# Patient Record
Sex: Female | Born: 1960 | Race: White | Hispanic: No | Smoking: Never smoker
Health system: Southern US, Community
[De-identification: ages and names within clinical notes are randomized; demographics above are authoritative.]

## PROBLEM LIST (undated history)

## (undated) DIAGNOSIS — G4730 Sleep apnea, unspecified: Secondary | ICD-10-CM

## (undated) DIAGNOSIS — D649 Anemia, unspecified: Secondary | ICD-10-CM

## (undated) DIAGNOSIS — N289 Disorder of kidney and ureter, unspecified: Secondary | ICD-10-CM

## (undated) DIAGNOSIS — E079 Disorder of thyroid, unspecified: Secondary | ICD-10-CM

## (undated) DIAGNOSIS — F4310 Post-traumatic stress disorder, unspecified: Secondary | ICD-10-CM

## (undated) DIAGNOSIS — I10 Essential (primary) hypertension: Secondary | ICD-10-CM

## (undated) DIAGNOSIS — M797 Fibromyalgia: Secondary | ICD-10-CM

## (undated) DIAGNOSIS — E282 Polycystic ovarian syndrome: Secondary | ICD-10-CM

## (undated) HISTORY — PX: PARATHYROIDECTOMY: SHX19

## (undated) HISTORY — PX: KIDNEY SURGERY: SHX687

---

## 1994-06-27 HISTORY — PX: ABDOMINAL EXPLORATION SURGERY: SHX538

## 2000-06-27 HISTORY — PX: GASTRIC BYPASS: SHX52

## 2007-08-10 ENCOUNTER — Emergency Department (HOSPITAL_COMMUNITY): Admission: EM | Admit: 2007-08-10 | Discharge: 2007-08-10 | Payer: Self-pay | Admitting: Emergency Medicine

## 2020-05-11 ENCOUNTER — Emergency Department (HOSPITAL_BASED_OUTPATIENT_CLINIC_OR_DEPARTMENT_OTHER): Payer: Medicare HMO

## 2020-05-11 ENCOUNTER — Encounter (HOSPITAL_BASED_OUTPATIENT_CLINIC_OR_DEPARTMENT_OTHER): Payer: Self-pay

## 2020-05-11 ENCOUNTER — Other Ambulatory Visit: Payer: Self-pay

## 2020-05-11 ENCOUNTER — Emergency Department (HOSPITAL_BASED_OUTPATIENT_CLINIC_OR_DEPARTMENT_OTHER)
Admission: EM | Admit: 2020-05-11 | Discharge: 2020-05-11 | Disposition: A | Discharge status: Home / Self Care | Payer: Medicare HMO | Attending: Emergency Medicine | Admitting: Emergency Medicine

## 2020-05-11 DIAGNOSIS — Z79899 Other long term (current) drug therapy: Secondary | ICD-10-CM | POA: Insufficient documentation

## 2020-05-11 DIAGNOSIS — R0602 Shortness of breath: Secondary | ICD-10-CM | POA: Diagnosis not present

## 2020-05-11 DIAGNOSIS — I129 Hypertensive chronic kidney disease with stage 1 through stage 4 chronic kidney disease, or unspecified chronic kidney disease: Secondary | ICD-10-CM | POA: Diagnosis not present

## 2020-05-11 DIAGNOSIS — N189 Chronic kidney disease, unspecified: Secondary | ICD-10-CM | POA: Insufficient documentation

## 2020-05-11 DIAGNOSIS — Z20822 Contact with and (suspected) exposure to covid-19: Secondary | ICD-10-CM | POA: Insufficient documentation

## 2020-05-11 DIAGNOSIS — Z91040 Latex allergy status: Secondary | ICD-10-CM | POA: Diagnosis not present

## 2020-05-11 DIAGNOSIS — R079 Chest pain, unspecified: Secondary | ICD-10-CM | POA: Insufficient documentation

## 2020-05-11 HISTORY — DX: Disorder of thyroid, unspecified: E07.9

## 2020-05-11 HISTORY — DX: Essential (primary) hypertension: I10

## 2020-05-11 HISTORY — DX: Post-traumatic stress disorder, unspecified: F43.10

## 2020-05-11 HISTORY — DX: Disorder of kidney and ureter, unspecified: N28.9

## 2020-05-11 HISTORY — DX: Fibromyalgia: M79.7

## 2020-05-11 HISTORY — DX: Sleep apnea, unspecified: G47.30

## 2020-05-11 HISTORY — DX: Anemia, unspecified: D64.9

## 2020-05-11 HISTORY — DX: Polycystic ovarian syndrome: E28.2

## 2020-05-11 LAB — BASIC METABOLIC PANEL
Anion gap: 11 (ref 5–15)
BUN: 11 mg/dL (ref 6–20)
CO2: 21 mmol/L — ABNORMAL LOW (ref 22–32)
Calcium: 7.4 mg/dL — ABNORMAL LOW (ref 8.9–10.3)
Chloride: 105 mmol/L (ref 98–111)
Creatinine, Ser: 1.22 mg/dL — ABNORMAL HIGH (ref 0.44–1.00)
GFR, Estimated: 52 mL/min — ABNORMAL LOW (ref >60)
Glucose, Bld: 95 mg/dL (ref 70–99)
Potassium: 3.4 mmol/L — ABNORMAL LOW (ref 3.5–5.1)
Sodium: 137 mmol/L (ref 135–145)

## 2020-05-11 LAB — CBC WITH DIFFERENTIAL/PLATELET
Abs Immature Granulocytes: 0.04 10*3/uL (ref 0.00–0.07)
Basophils Absolute: 0.1 10*3/uL (ref 0.0–0.1)
Basophils Relative: 1 %
Eosinophils Absolute: 0.2 10*3/uL (ref 0.0–0.5)
Eosinophils Relative: 3 %
HCT: 37.4 % (ref 36.0–46.0)
Hemoglobin: 11.4 g/dL — ABNORMAL LOW (ref 12.0–15.0)
Immature Granulocytes: 1 %
Lymphocytes Relative: 16 %
Lymphs Abs: 1.1 10*3/uL (ref 0.7–4.0)
MCH: 26.6 pg (ref 26.0–34.0)
MCHC: 30.5 g/dL (ref 30.0–36.0)
MCV: 87.4 fL (ref 80.0–100.0)
Monocytes Absolute: 0.4 10*3/uL (ref 0.1–1.0)
Monocytes Relative: 6 %
Neutro Abs: 5.1 10*3/uL (ref 1.7–7.7)
Neutrophils Relative %: 73 %
Platelets: 216 10*3/uL (ref 150–400)
RBC: 4.28 MIL/uL (ref 3.87–5.11)
RDW: 15.1 % (ref 11.5–15.5)
WBC: 6.9 10*3/uL (ref 4.0–10.5)
nRBC: 0 % (ref 0.0–0.2)

## 2020-05-11 LAB — D-DIMER, QUANTITATIVE: D-Dimer, Quant: 0.54 ug/mL-FEU — ABNORMAL HIGH (ref 0.00–0.50)

## 2020-05-11 LAB — HEPATIC FUNCTION PANEL
ALT: 14 U/L (ref 0–44)
AST: 13 U/L — ABNORMAL LOW (ref 15–41)
Albumin: 4 g/dL (ref 3.5–5.0)
Alkaline Phosphatase: 65 U/L (ref 38–126)
Bilirubin, Direct: 0.1 mg/dL (ref 0.0–0.2)
Indirect Bilirubin: 0.5 mg/dL (ref 0.3–0.9)
Total Bilirubin: 0.6 mg/dL (ref 0.3–1.2)
Total Protein: 7 g/dL (ref 6.5–8.1)

## 2020-05-11 LAB — RESPIRATORY PANEL BY RT PCR (FLU A&B, COVID)
Influenza A by PCR: NEGATIVE
Influenza B by PCR: NEGATIVE
SARS Coronavirus 2 by RT PCR: NEGATIVE

## 2020-05-11 LAB — TROPONIN I (HIGH SENSITIVITY): Troponin I (High Sensitivity): 2 ng/L (ref <18)

## 2020-05-11 MED ORDER — ONDANSETRON HCL 4 MG PO TABS: 4.0000 mg | ORAL_TABLET | Freq: Three times a day (TID) | ORAL | 0 refills | Status: AC | PRN

## 2020-05-11 NOTE — ED Triage Notes (Signed)
Pt arrives with reports of parathyroid ectomy on Monday states that she has been SOB since. Pt was not Covid Tested prior to her surgery. Denies any history of asthma.

## 2020-05-11 NOTE — ED Notes (Signed)
Discharge instructions discussed with patient. Verbalized understanding of follow up care. Departs ED at this time in stable condition.

## 2020-05-11 NOTE — ED Provider Notes (Signed)
[facility] EMERGENCY DEPARTMENT Provider Note   CSN: [id number] Arrival date & time: 05/11/20  1736     History Chief Complaint  Patient presents with  . Shortness of Breath  . Chest Pain    Gina Dawson is a 59 y.o. female.  The history is provided by the patient.  Shortness of Breath Severity:  Moderate Onset quality:  Gradual Timing:  Intermittent Progression:  Waxing and waning Chronicity:  New Relieved by:  Nothing Worsened by:  Nothing Associated symptoms: chest pain   Associated symptoms: no abdominal pain, no claudication, no cough, no diaphoresis, no ear pain, no fever, no headaches, no hemoptysis, no neck pain, no PND, no rash, no sore throat, no sputum production, no syncope, no swollen glands, no vomiting and no wheezing   Risk factors: recent surgery (parathyroid removed last week, stayed overnight.)   Risk factors: no hx of PE/DVT        Past Medical History:  Diagnosis Date  . Anemia   . Fibromyalgia   . Hypertension   . PCOS (polycystic ovarian syndrome)   . PTSD (post-traumatic stress disorder)   . Renal disorder   . Sleep apnea   . Thyroid disease     There are no problems to display for this patient.     OB History   No obstetric history on file.     No family history on file.  Social History   Tobacco Use  . Smoking status: Never Smoker  . Smokeless tobacco: Never Used  Substance Use Topics  . Alcohol use: Never  . Drug use: Never    Home Medications Prior to Admission medications   Medication Sig Start Date End Date Taking? Authorizing Provider  amLODipine (NORVASC) 10 MG tablet Take by mouth. 10/12/19  Yes [provider]  baclofen (LIORESAL) 10 MG tablet Take by mouth. 03/20/19  Yes [provider]  buPROPion (WELLBUTRIN XL) 300 MG 24 hr tablet Take by mouth. 12/27/19  Yes [provider]  busPIRone (BUSPAR) 5 MG tablet Take by mouth. 11/21/19  Yes [provider]  calcium  carbonate (OS-CAL) 1250 (500 Ca) MG chewable tablet Chew by mouth. 05/05/20  Yes [provider]  DULoxetine (CYMBALTA) 60 MG capsule Take by mouth. 01/28/19  Yes [provider]  fluticasone (FLONASE) 50 MCG/ACT nasal spray 2 sprays by Each Nare route 2 (two) times daily as needed for Allergies. 09/25/18  Yes [provider]  furosemide (LASIX) 40 MG tablet Take by mouth. 10/02/18  Yes [provider]  potassium chloride SA (KLOR-CON) 20 MEQ tablet  03/20/19  Yes [provider]  pregabalin (LYRICA) 75 MG capsule Take by mouth. 08/27/18  Yes [provider]  topiramate (TOPAMAX) 50 MG tablet Take by mouth. 08/08/19  Yes [provider]  traZODone (DESYREL) 100 MG tablet Take by mouth. 07/27/19  Yes [provider]  oxybutynin (DITROPAN-XL) 10 MG 24 hr tablet Take by mouth.    [provider]    Allergies    Sulfamethoxazole-trimethoprim, Iodine, Latex, Sulfur, and Sulfa antibiotics  Review of Systems   Review of Systems  Constitutional: Negative for chills, diaphoresis and fever.  HENT: Negative for ear pain and sore throat.   Eyes: Negative for pain and visual disturbance.  Respiratory: Positive for shortness of breath. Negative for cough, hemoptysis, sputum production and wheezing.   Cardiovascular: Positive for chest pain. Negative for palpitations, claudication, syncope and PND.  Gastrointestinal: Negative for abdominal pain and vomiting.  Genitourinary: Negative for dysuria and hematuria.  Musculoskeletal: Negative for arthralgias, back pain and neck pain.  Skin: Negative for color change and rash.  Neurological: Negative for seizures, syncope and headaches.  Psychiatric/Behavioral: The patient is nervous/anxious.   All other systems reviewed and are negative.   Physical Exam Updated Vital Signs BP 137/66   Pulse 78   Temp 98.4 F (36.9 C) (Oral)   Resp (!) 22   Ht 5\' 4"  (1.626 m)   Wt 133.8 kg   SpO2  100%   BMI 50.64 kg/m   Physical Exam Vitals and nursing note reviewed.  Constitutional:      General: She is not in acute distress.    Appearance: She is well-developed. She is not ill-appearing.  HENT:     Head: Normocephalic and atraumatic.  Eyes:     Extraocular Movements: Extraocular movements intact.     Conjunctiva/sclera: Conjunctivae normal.     Pupils: Pupils are equal, round, and reactive to light.  Neck:     Comments: Overall surgical site in the thyroid appears clean dry and intact, no major swelling, no crepitus, no erythema, normal phonation, no trismus Cardiovascular:     Rate and Rhythm: Normal rate and regular rhythm.     Pulses: Normal pulses.     Heart sounds: Normal heart sounds. No murmur heard.   Pulmonary:     Effort: Pulmonary effort is normal. No respiratory distress.     Breath sounds: Normal breath sounds. No decreased breath sounds, wheezing, rhonchi or rales.  Abdominal:     Palpations: Abdomen is soft.     Tenderness: There is no abdominal tenderness.  Musculoskeletal:     Cervical back: Normal range of motion and neck supple.     Right lower leg: No edema.     Left lower leg: No edema.  Skin:    General: Skin is warm and dry.  Neurological:     General: No focal deficit present.     Mental Status: She is alert.  Psychiatric:        Mood and Affect: Mood is anxious.     ED Results / Procedures / Treatments   Labs (all labs ordered are listed, but only abnormal results are displayed) Labs Reviewed  CBC WITH DIFFERENTIAL/PLATELET - Abnormal; Notable for the following components:      Result Value   Hemoglobin 11.4 (*)    All other components within normal limits  BASIC METABOLIC PANEL - Abnormal; Notable for the following components:   Potassium 3.4 (*)    CO2 21 (*)    Creatinine, Ser 1.22 (*)    Calcium 7.4 (*)    GFR, Estimated 52 (*)    All other components within normal limits  HEPATIC FUNCTION PANEL - Abnormal; Notable for  the following components:   AST 13 (*)    All other components within normal limits  D-DIMER, QUANTITATIVE (NOT AT [facility]) - Abnormal; Notable for the following components:   D-Dimer, Quant 0.54 (*)    All other components within normal limits  RESPIRATORY PANEL BY RT PCR (FLU A&B, COVID)  TROPONIN I (HIGH SENSITIVITY)    EKG EKG Interpretation  Date/Time:  Monday May 11 2020 17:53:27 EST Ventricular Rate:  71 PR Interval:    QRS Duration: 106 QT Interval:  449 QTC Calculation: 488 R Axis:   -3 Text Interpretation: Sinus rhythm Low voltage, precordial leads s Borderline prolonged QT interval Confirmed by 12-20-2002 [phone]) on 05/11/2020 6:04:11 PM  Radiology DG Chest Portable 1 View  Result Date: 05/11/2020 CLINICAL DATA:  Chest pain and SOB. Status post parathyroidectomy on Monday with persistent shortness of breath since surgery. EXAM: PORTABLE CHEST 1 VIEW COMPARISON:  None. FINDINGS: The heart size and mediastinal contours are within normal limits. Both lungs are clear. The visualized skeletal structures are unremarkable. IMPRESSION: No active disease. Electronically Signed   By: Signa Kell M.D.   On: 05/11/2020 18:18    Procedures Procedures (including critical care time)  Medications Ordered in ED Medications - No data to display  ED Course  I have reviewed the triage vital signs and the nursing notes.  Pertinent labs & imaging results that were available during my care of the patient were reviewed by me and considered in my medical decision making (see chart for details).    MDM Rules/Calculators/A&P                          Micah Galeno is a 59 year old female with history of sleep apnea, CKD who presents the ED with chest pain, shortness of breath.  Symptoms on and off for the last week.  Had parathyroid removed last week.  Stayed overnight in the hospital.  Has no history of cardiac disease.  No history of PE.  No asthma symptoms.  No fever, no  cough.  Overall she appears comfortable in the room but upon questioning she did become tearful and anxious.  EKG shows sinus rhythm.  No ischemic changes.  Will evaluate for ACS with troponin.  Will get D-dimer but overall believe PE risk is low.  She has not had any significant bleeding post her surgery.  Will check for anemia and electrolyte abnormality.  Possibly this could be from some anxiety as well.  Will evaluate for Covid as well.  D-dimer is age-adjusted normal.  Troponin normal.  No significant anemia, electrolyte abnormality, kidney injury.  Covid test is negative.  Chest x-ray without any signs of infection.  Overall suspect some anxiety as cause of symptoms today.  We will have her follow-up with primary care doctor.  Discharged in the ED in good condition.  This chart was dictated using voice recognition software.  Despite best efforts to proofread,  errors can occur which can change the documentation meaning.   Final Clinical Impression(s) / ED Diagnoses Final diagnoses:  Shortness of breath    Rx / DC Orders ED Discharge Orders    None       Virgina Norfolk, DO 05/11/20 1915

## 2021-10-07 IMAGING — DX DG CHEST 1V PORT
1 series · 1 of 1 positions shown · non-contrast
Comparison: None.

CLINICAL DATA: Chest pain and SOB. Status post parathyroidectomy on
[REDACTED] with persistent shortness of breath since surgery.

EXAM:
PORTABLE CHEST 1 VIEW

[chest ap]
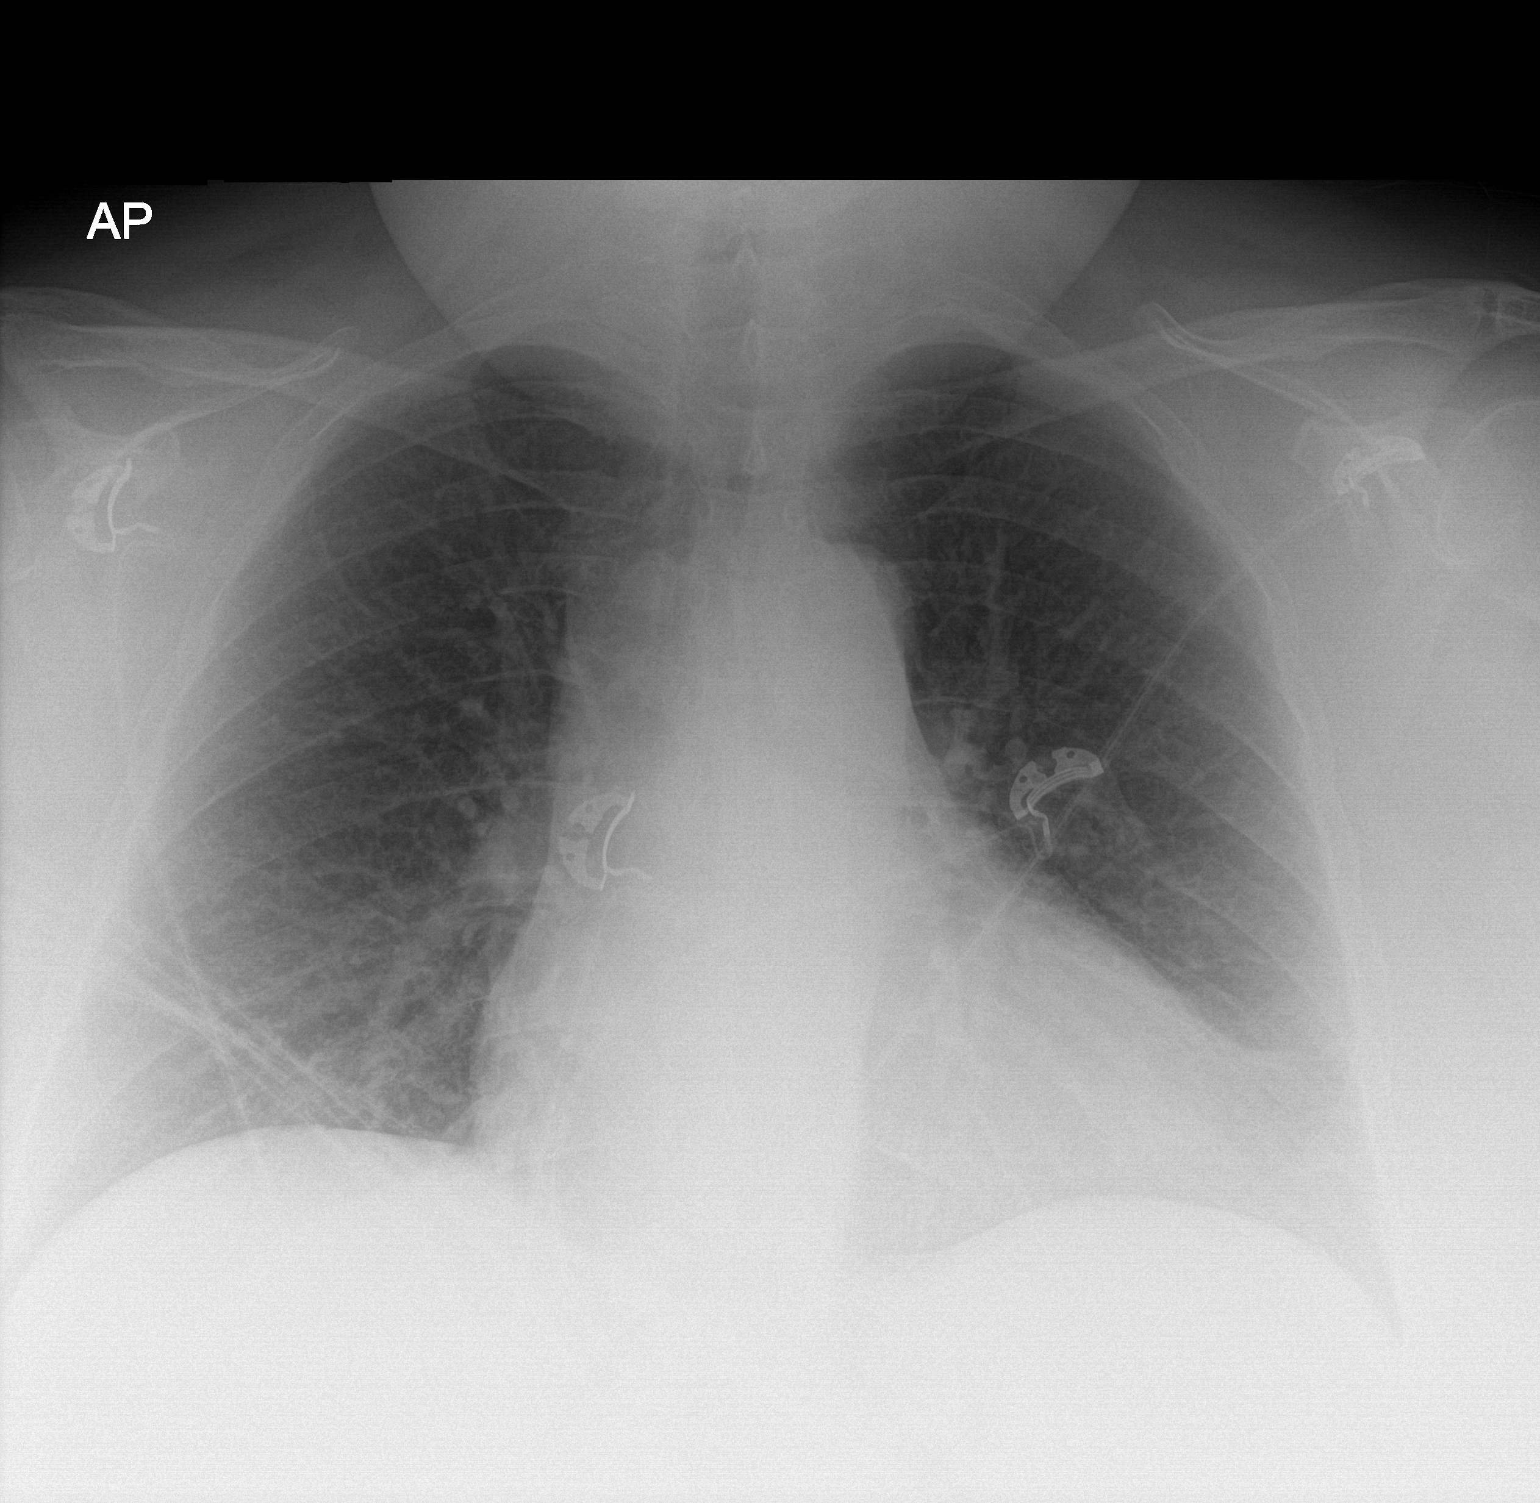

[1 of 1 positions shown; findings below may reference images not displayed]

FINDINGS: The heart size and mediastinal contours are within normal limits.
Both lungs are clear. The visualized skeletal structures are
unremarkable.
IMPRESSION: No active disease.
